# Patient Record
Sex: Male | Born: 1990 | Smoking: Former smoker
Health system: Southern US, Community
[De-identification: ages and names within clinical notes are randomized; demographics above are authoritative.]

---

## 2017-11-09 ENCOUNTER — Ambulatory Visit: Payer: BC Managed Care – PPO | Admitting: Sports Medicine

## 2017-11-09 ENCOUNTER — Ambulatory Visit (INDEPENDENT_AMBULATORY_CARE_PROVIDER_SITE_OTHER): Payer: BC Managed Care – PPO

## 2017-11-09 ENCOUNTER — Telehealth: Payer: Self-pay | Admitting: *Deleted

## 2017-11-09 ENCOUNTER — Encounter: Payer: Self-pay | Admitting: Sports Medicine

## 2017-11-09 VITALS — BP 86/48 | HR 78 | Resp 16

## 2017-11-09 DIAGNOSIS — S99922A Unspecified injury of left foot, initial encounter: Secondary | ICD-10-CM

## 2017-11-09 DIAGNOSIS — M79672 Pain in left foot: Secondary | ICD-10-CM

## 2017-11-09 MED ORDER — METHYLPREDNISOLONE 4 MG PO TBPK: ORAL_TABLET | ORAL | 0 refills | Status: AC

## 2017-11-09 NOTE — Progress Notes (Signed)
Subjective: Jonathan Schmidt is a 27 y.o. male patient who presents to office for evaluation of Left foot pain. Patient complains of progressive pain especially over the last month at the top of left foot and in btw 1-2nd toes on left after wood desk fell on his foot. Ranks pain 7/10 and is now interferring with daily activities difficult to walk by end of day; Heat helps. Patient has tried rest and time with no relief in symptoms. Patient denies any other pedal complaints.   Review of Systems  Musculoskeletal:       Left foot pain  All other systems reviewed and are negative.   There are no active problems to display for this patient.   Current Outpatient Medications on File Prior to Visit  Medication Sig Dispense Refill  . orphenadrine (NORFLEX) 100 MG tablet Take by mouth.     No current facility-administered medications on file prior to visit.     Allergies  Allergen Reactions  . Amoxicillin-Pot Clavulanate Diarrhea and Other (See Comments)    Objective:  General: Alert and oriented x3 in no acute distress  Dermatology: No open lesions bilateral lower extremities, no webspace macerations, no ecchymosis bilateral, all nails x 10 are well manicured.  Vascular: Dorsalis Pedis and Posterior Tibial pedal pulses palpable, Capillary Fill Time 3 seconds,(+) pedal hair growth bilateral, no edema bilateral lower extremities, Temperature gradient within normal limits.  Neurology: Gross sensation intact via light touch bilateral, + Tinel at DP nerve on left.   Musculoskeletal: Mild tenderness with palpation at 1st Ray on left foot,No pain with calf compression bilateral.  Strength within normal limits in all groups bilateral except left where there is guarding.   Gait: Antalgic gait  Xrays  Left Foot   Impression: No acute findings.   Assessment and Plan: Problem List Items Addressed This Visit    None    Visit Diagnoses    Injury of left foot, initial encounter    -  Primary   Relevant Orders   DG Foot Complete Left   Left foot pain           -Complete examination performed -Xrays reviewed -Discussed treatement options -Dispensed CAM boot to wear at all time -Dispensed compression sleeve -Rx Medrol and recommend rest, ice, heat, and elevation -Rx MRI to further eval for tendon or nerve injury since Xray is neagtive -Patient to return to office after MRI or sooner if condition worsens.  Jonathan Schmidt, DPM

## 2017-11-09 NOTE — Telephone Encounter (Signed)
-----   Message from Jonathan [place], [place] sent at 11/09/2017 10:19 AM EDT ----- Regarding: MRI left foot Pain to left foot after dropping wood desk 1 month ago with pain that still painful

## 2017-11-10 NOTE — Progress Notes (Signed)
[employer] Auth for MRI #[id number]

## 2017-11-20 ENCOUNTER — Ambulatory Visit
Admission: RE | Admit: 2017-11-20 | Discharge: 2017-11-20 | Disposition: A | Discharge status: Home / Self Care | Payer: BC Managed Care – PPO | Source: Ambulatory Visit | Attending: Sports Medicine | Admitting: Sports Medicine

## 2017-11-23 ENCOUNTER — Telehealth: Payer: Self-pay | Admitting: *Deleted

## 2017-11-23 ENCOUNTER — Encounter: Payer: Self-pay | Admitting: *Deleted

## 2017-11-23 NOTE — Telephone Encounter (Signed)
Mailed note informing pt of Dr. Wynema Birch review of results and orders.

## 2017-11-23 NOTE — Telephone Encounter (Signed)
Unable to leave a message voicemail box is not set up. 

## 2017-11-23 NOTE — Telephone Encounter (Signed)
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 Please make sure patient has finished his steroid dose pak and if he is still having pain send Mobic  I tab PO daily x 30 tabs and have him follow up with me in 3-4 weeks Thanks Dr. Marylene Land

## 2017-11-25 ENCOUNTER — Telehealth: Payer: Self-pay | Admitting: *Deleted

## 2017-11-25 NOTE — Telephone Encounter (Signed)
I informed pt of the results as reviewed by Dr. Marylene Land and her orders.

## 2019-09-23 IMAGING — MR MR FOOT*L* W/O CM
6 series · 40 of 40 positions shown · non-contrast
Comparison: Radiographs 11/09/2017

CLINICAL DATA: Patient dropped a desk on is foot 1 month ago.
Persistent medial forefoot pain

EXAM:
MRI OF THE LEFT FOOT WITHOUT CONTRAST
TECHNIQUE: Multiplanar, multisequence MR imaging of the left foot was
performed. No intravenous contrast was administered.

[Series 4: T1 · coronal · 4.0mm · 0.44mm/px · 8 of 30 slices shown (1 of 2)]
[im 1/30]
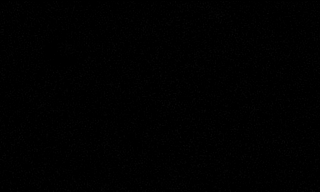
[im 5/30]
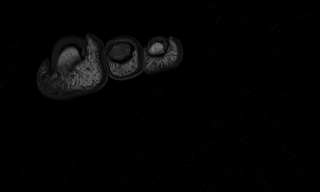
[im 9/30]
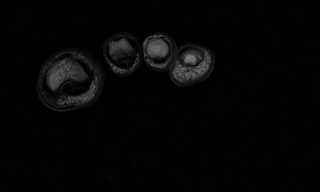
[im 13/30]
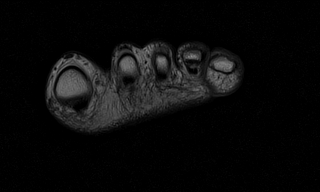
[im 17/30]
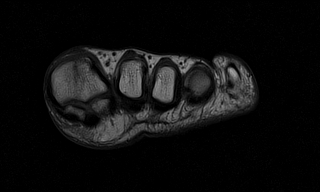
[im 21/30]
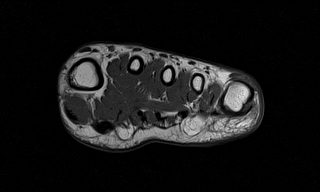
[im 25/30]
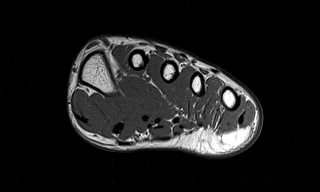
[im 30/30]
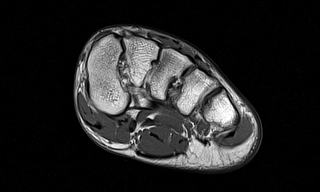

[Series 5: T2 fat-sat · coronal · 4.0mm · 0.27mm/px · 7 of 30 slices shown]
[im 1/30]
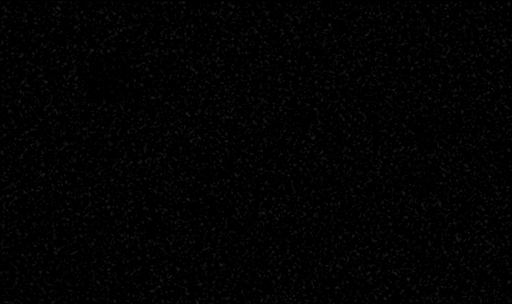
[im 5/30]
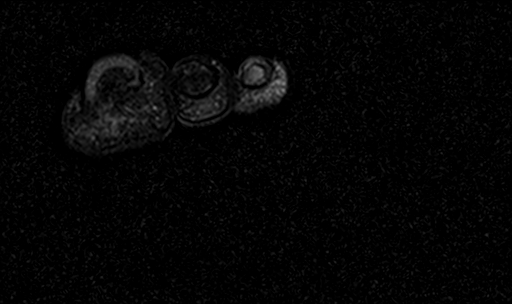
[im 10/30]
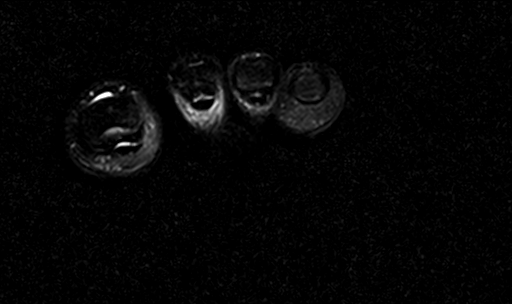
[im 15/30]
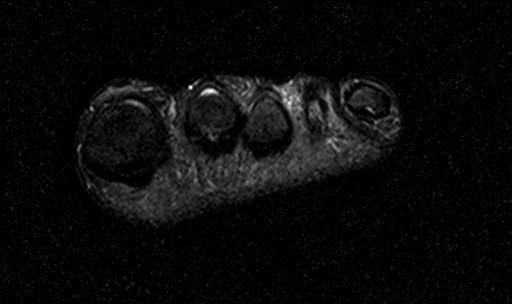
[im 20/30]
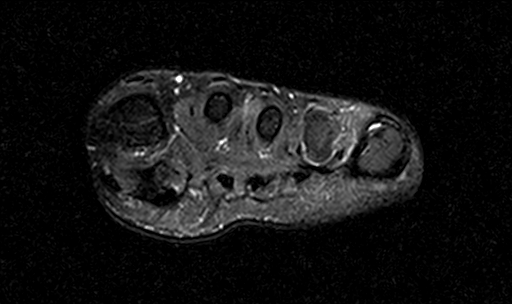
[im 25/30]
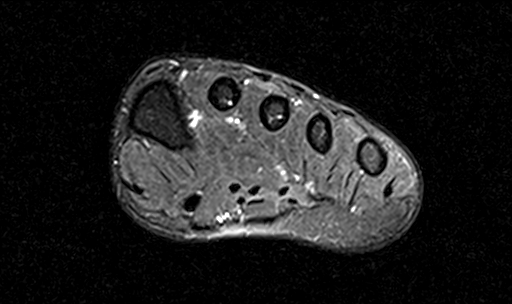
[im 30/30]
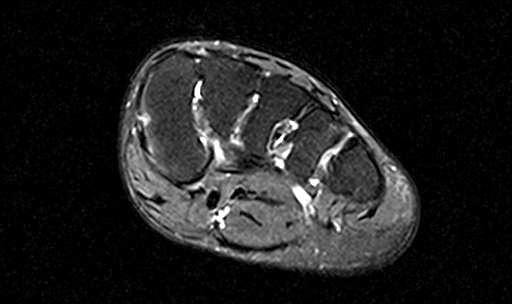

[Series 6: T1 fat-sat · coronal · 4.0mm · 0.44mm/px · 7 of 30 slices shown]
[im 1/30]
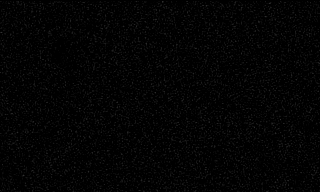
[im 5/30]
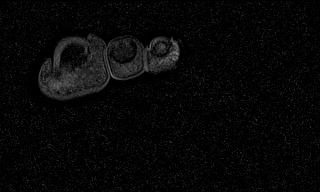
[im 10/30]
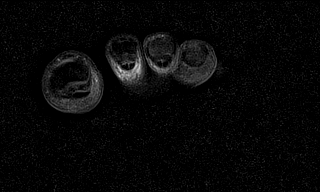
[im 15/30]
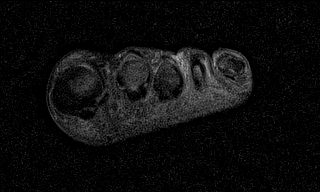
[im 20/30]
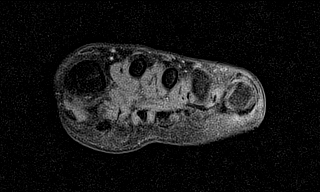
[im 25/30]
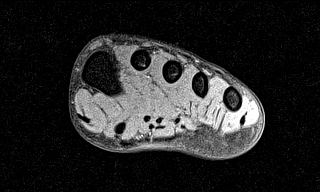
[im 30/30]
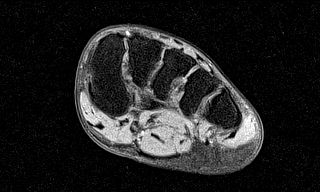

[Series 7: STIR · oblique · 4.0mm · 0.70mm/px · 6 of 24 slices shown (1 of 2)]
[im 1/24]
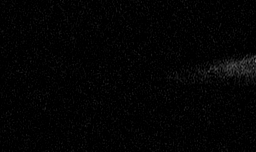
[im 5/24]
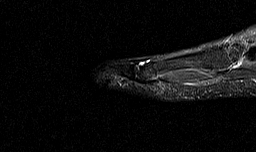
[im 10/24]
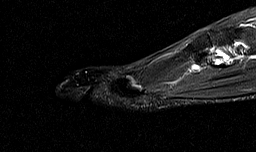
[im 14/24]
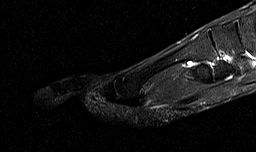
[im 19/24]
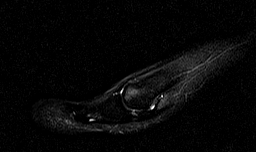
[im 24/24]
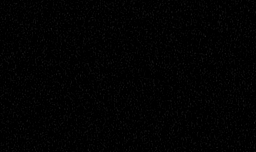

[Series 11: T1 · oblique · 3.0mm · 0.62mm/px · 6 of 25 slices shown (2 of 2)]
[im 1/25]
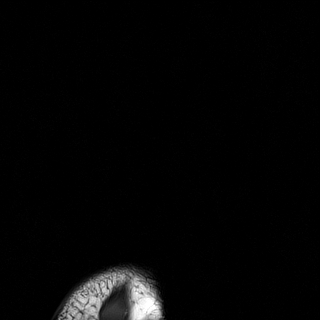
[im 5/25]
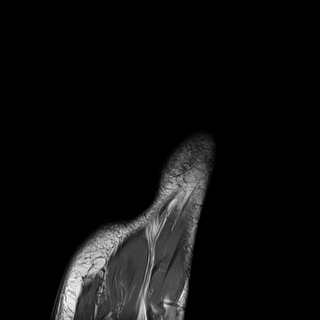
[im 10/25]
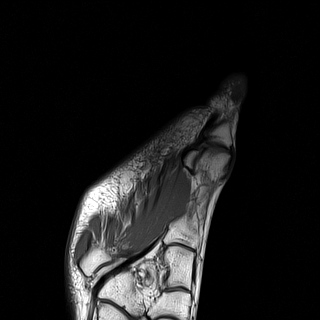
[im 15/25]
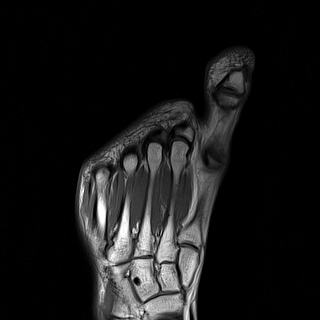
[im 20/25]
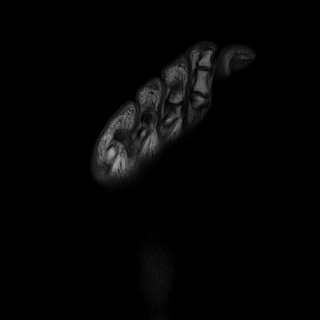
[im 25/25]
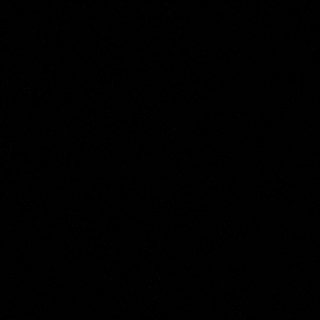

[Series 12: STIR · oblique · 3.0mm · 0.78mm/px · 6 of 25 slices shown (2 of 2)]
[im 1/25]
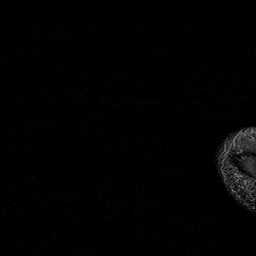
[im 5/25]
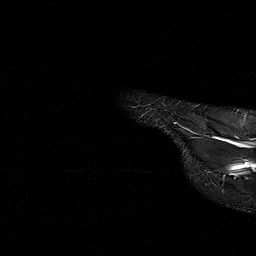
[im 10/25]
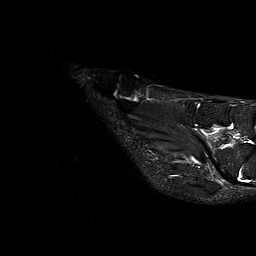
[im 15/25]
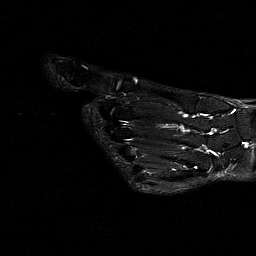
[im 20/25]
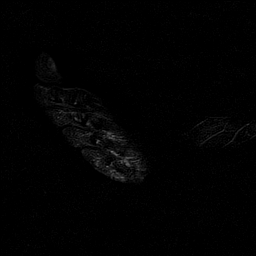
[im 25/25]
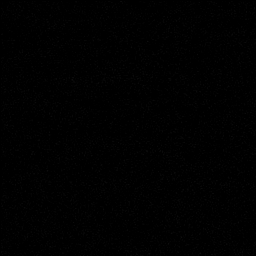

[40 of 40 positions shown; findings below may reference images not displayed]

FINDINGS: The bony structures are intact. No fracture or bone lesion is
identified. There are mild degenerative changes at the first MTP
joint but no hallux valgus of 40. The other MTP joints are normal.
No findings to suggest an inflammatory arthropathy. The midfoot bony
structures are intact.

No soft tissue mass or hematoma. The foot musculature appears
normal. The major tendons and ligaments appear intact. The sesamoid
bones appear normal.
IMPRESSION: Unremarkable MR examination of the foot. No fracture or soft tissue
injury is identified.

Mild degenerative changes at the first MTP joint.

## 2023-08-10 ENCOUNTER — Other Ambulatory Visit (HOSPITAL_COMMUNITY): Payer: Self-pay
